# Patient Record
Sex: Male | Born: 2000 | Hispanic: Yes | Marital: Single | Smoking: Current every day smoker
Health system: Southern US, Community
[De-identification: ages and names within clinical notes are randomized; demographics above are authoritative.]

## PROBLEM LIST (undated history)

## (undated) DIAGNOSIS — E7800 Pure hypercholesterolemia, unspecified: Secondary | ICD-10-CM

---

## 2020-06-28 ENCOUNTER — Ambulatory Visit (HOSPITAL_COMMUNITY)
Admission: EM | Admit: 2020-06-28 | Discharge: 2020-06-28 | Disposition: A | Discharge status: Home / Self Care | Payer: Self-pay | Attending: Family Medicine | Admitting: Family Medicine

## 2020-06-28 ENCOUNTER — Other Ambulatory Visit: Payer: Self-pay

## 2020-06-28 ENCOUNTER — Ambulatory Visit (INDEPENDENT_AMBULATORY_CARE_PROVIDER_SITE_OTHER): Payer: Self-pay

## 2020-06-28 ENCOUNTER — Encounter (HOSPITAL_COMMUNITY): Payer: Self-pay

## 2020-06-28 DIAGNOSIS — M25532 Pain in left wrist: Secondary | ICD-10-CM

## 2020-06-28 DIAGNOSIS — M778 Other enthesopathies, not elsewhere classified: Secondary | ICD-10-CM

## 2020-06-28 DIAGNOSIS — S62102S Fracture of unspecified carpal bone, left wrist, sequela: Secondary | ICD-10-CM

## 2020-06-28 HISTORY — DX: Pure hypercholesterolemia, unspecified: E78.00

## 2020-06-28 MED ORDER — IBUPROFEN 800 MG PO TABS: 800.0000 mg | ORAL_TABLET | Freq: Three times a day (TID) | ORAL | 0 refills | Status: DC

## 2020-06-28 NOTE — ED Triage Notes (Addendum)
Pt presents with left wrist pain x 2 weeks. 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 Wrist splint give somewhat Urelief.

## 2020-06-28 NOTE — Discharge Instructions (Signed)
Your x-ray does show an old wrist fracture.  Nothing new is broken or injured You have tendinitis in your wrist You need to wear wrist brace for likely 2 weeks Ice may help after working to reduce pain and inflammation Take ibuprofen 3 times a day with food, to reduce pain and inflammation Be careful with heavy lifting
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
## 2020-07-11 ENCOUNTER — Encounter (HOSPITAL_COMMUNITY): Payer: Self-pay | Admitting: Emergency Medicine

## 2020-07-11 ENCOUNTER — Other Ambulatory Visit: Payer: Self-pay

## 2020-07-11 ENCOUNTER — Ambulatory Visit (HOSPITAL_COMMUNITY)
Admission: EM | Admit: 2020-07-11 | Discharge: 2020-07-11 | Disposition: A | Discharge status: Home / Self Care | Payer: Self-pay | Attending: Family Medicine | Admitting: Family Medicine

## 2020-07-11 ENCOUNTER — Ambulatory Visit (INDEPENDENT_AMBULATORY_CARE_PROVIDER_SITE_OTHER): Payer: Self-pay

## 2020-07-11 DIAGNOSIS — M25532 Pain in left wrist: Secondary | ICD-10-CM

## 2020-07-11 NOTE — Discharge Instructions (Addendum)
Continue to wear the splint until feeling better.  Ibuprofen as needed.  Light duty for 1 week, no heavy lifting.  Sports medicine follow up for continued problems.

## 2020-07-11 NOTE — ED Triage Notes (Signed)
Pt c/o left wrist pain, seen here on 06/28/20 for same. States pain has improved slightly with Rx, and splint. States when he picks things up, feels pain "needles" sensation. Also reports some "tingling" when not performing ROM/use. Here to have determination for work limitations.  +2 radial pulse, brisk cap refill, fingers warm to touch, able to make fist, extend fingers.

## 2020-07-12 NOTE — ED Provider Notes (Signed)
[facility]    CSN: [id number] Arrival date & time: 07/11/20  1156      History   Chief Complaint Chief Complaint  Patient presents with  . Wrist Pain    HPI Calvin Gibson is a 19 y.o. male.   Patient is a 19 year old male presents today with left wrist pain.  Was seen here on 06/28/2020 for same.  Was diagnosed with tendinitis at that time.  He has been wearing splint and using ibuprofen as prescribed.  Reporting some improvement.  He would like to know about returning to work.  Symptoms he has some associated numbness and tingling in the area.  Does a lot of heavy lifting at work.     Past Medical History:  Diagnosis Date  . Hypercholesterolemia     There are no problems to display for this patient.   History reviewed. No pertinent surgical history.     Home Medications    Prior to Admission medications   Medication Sig Start Date End Date Taking? Authorizing Provider  ibuprofen (ADVIL) 800 MG tablet Take 1 tablet (800 mg total) by mouth 3 (three) times daily. 06/28/20  Yes Eustace Moore, MD    Family History History reviewed. No pertinent family history.  Social History Social History   Tobacco Use  . Smoking status: Never Smoker  . Smokeless tobacco: Never Used  Substance Use Topics  . Alcohol use: Never  . Drug use: Never     Allergies   Patient has no known allergies.   Review of Systems Review of Systems   Physical Exam Triage Vital Signs ED Triage Vitals [07/11/20 1442]  Enc Vitals Group     BP 127/62     Pulse Rate 79     Resp 18     Temp 98.2 F (36.8 C)     Temp Source Oral     SpO2 100 %     Weight      Height      Head Circumference      Peak Flow      Pain Score 6     Pain Loc      Pain Edu?      Excl. in GC?    No data found.  Updated Vital Signs BP 127/62 (BP Location: Left Arm)   Pulse 79   Temp 98.2 F (36.8 C) (Oral)   Resp 18   SpO2 100%   Visual Acuity Right Eye Distance:   Left  Eye Distance:   Bilateral Distance:    Right Eye Near:   Left Eye Near:    Bilateral Near:     Physical Exam Vitals and nursing note reviewed.  Constitutional:      Appearance: Normal appearance.  HENT:     Head: Normocephalic and atraumatic.     Nose: Nose normal.  Eyes:     Conjunctiva/sclera: Conjunctivae normal.  Pulmonary:     Effort: Pulmonary effort is normal.  Musculoskeletal:        General: Normal range of motion.     Cervical back: Normal range of motion.     Comments: Normal range of motion of the left wrist without swelling, erythema or bruising.  2+ radial pulse Normal temperature and sensation intact.  Skin:    General: Skin is warm and dry.  Neurological:     Mental Status: He is alert.  Psychiatric:        Mood and Affect: Mood normal.  UC Treatments / Results  Labs (all labs ordered are listed, but only abnormal results are displayed) Labs Reviewed - No data to display  EKG   Radiology DG Wrist Complete Left  Result Date: 07/11/2020 CLINICAL DATA:  Left wrist pain. EXAM: LEFT WRIST - COMPLETE 3+ VIEW COMPARISON:  June 28, 2020. FINDINGS: There is no evidence of acute fracture or dislocation. There is no evidence of arthropathy. Stable old ulnar styloid fracture is noted. Soft tissues are unremarkable. IMPRESSION: No acute abnormality seen in the left wrist. Electronically Signed   By: Lupita Raider M.D.   On: 07/11/2020 15:21    Procedures Procedures (including critical care time)  Medications Ordered in UC Medications - No data to display  Initial Impression / Assessment and Plan / UC Course  I have reviewed the triage vital signs and the nursing notes.  Pertinent labs & imaging results that were available during my care of the patient were reviewed by me and considered in my medical decision making (see chart for details).     Left wrist pain Most likely tendinitis Patient feeling better. Will give 1 more week with light  duty. Splint until feeling better Rest, ice, elevate, ibuprofen for pain Sports medicine for follow-up as needed Final Clinical Impressions(s) / UC Diagnoses   Final diagnoses:  Left wrist pain     Discharge Instructions     Continue to wear the splint until feeling better.  Ibuprofen as needed.  Light duty for 1 week, no heavy lifting.  Sports medicine follow up for continued problems.      ED Prescriptions    None     PDMP not reviewed this encounter.   Janace Aris, NP 07/12/20 [phone]

## 2021-08-17 IMAGING — DX DG WRIST COMPLETE 3+V*L*
4 series · 4 of 4 positions shown · non-contrast
Comparison: June 28, 2020.

CLINICAL DATA: Left wrist pain.

EXAM:
LEFT WRIST - COMPLETE 3+ VIEW

[wrist pa]
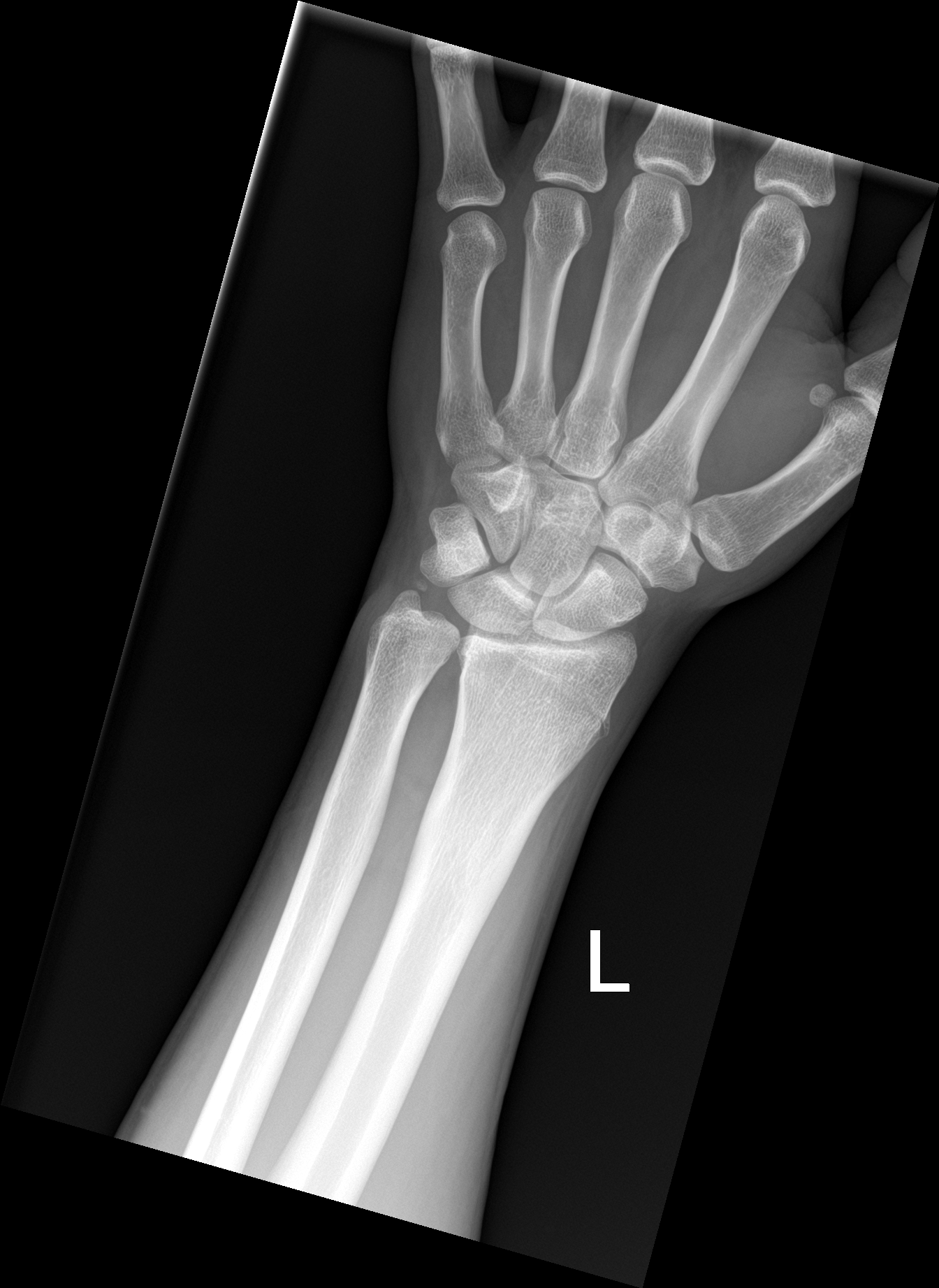

[wrist navicular]
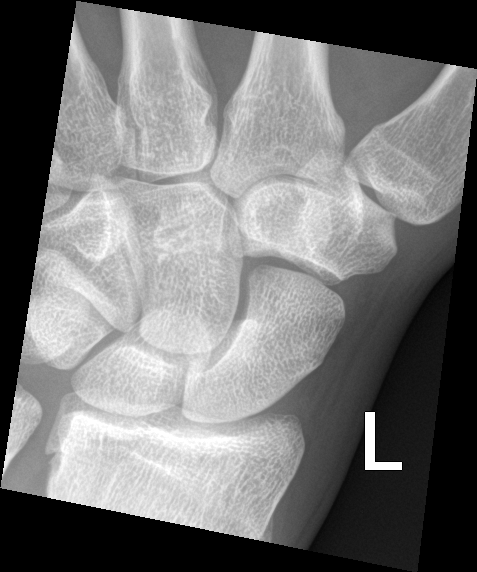

[wrist obl]
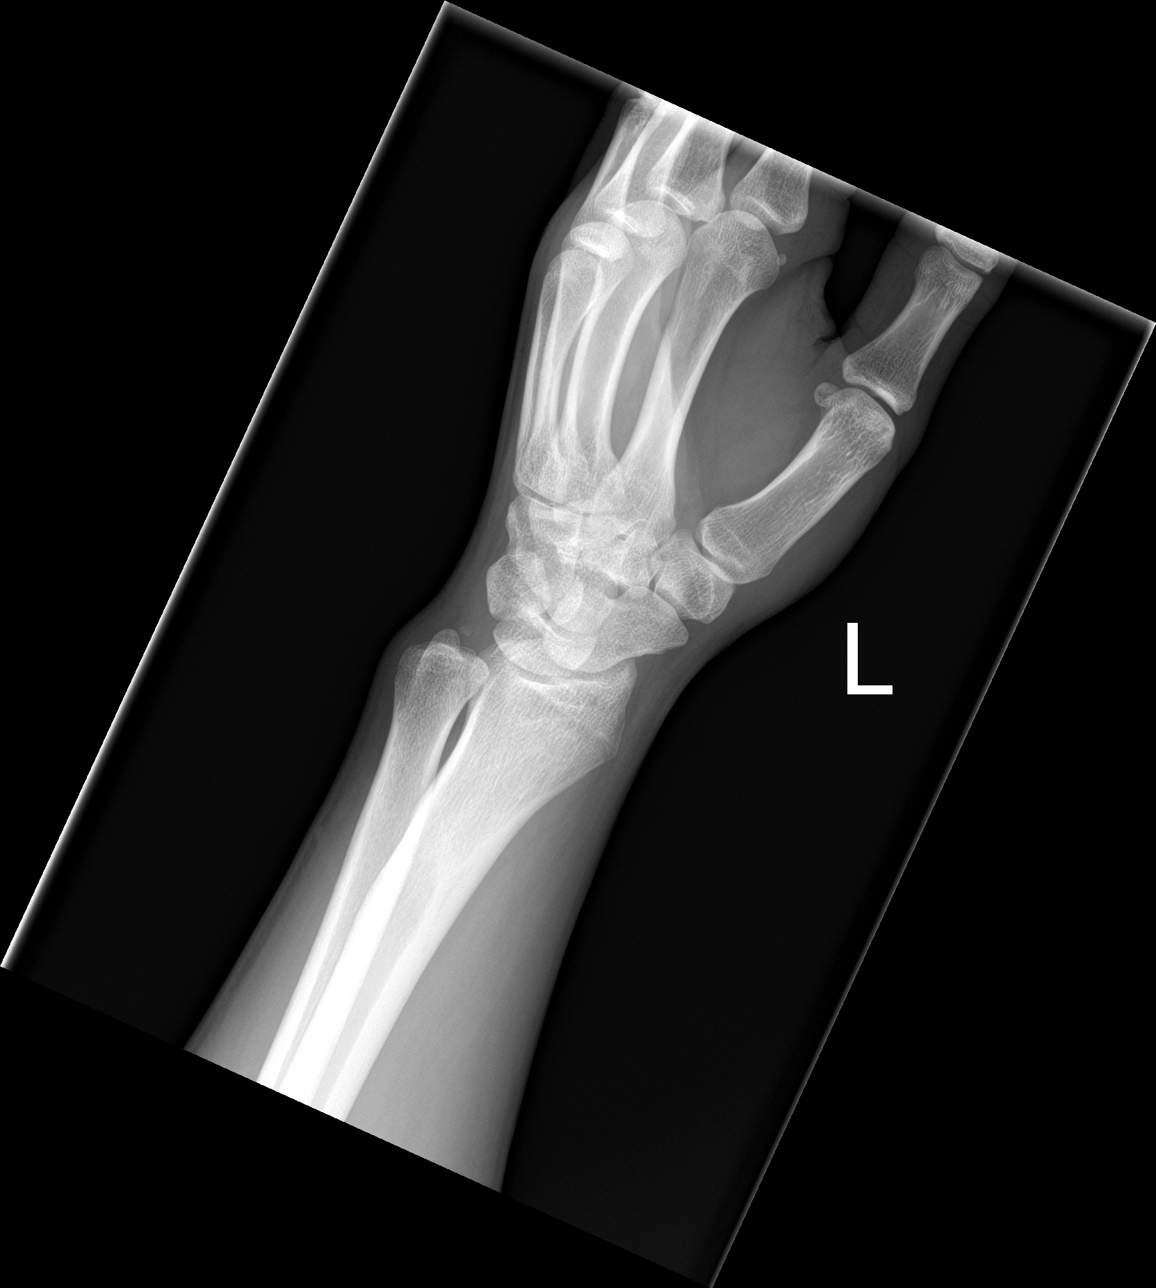

[wrist lat]
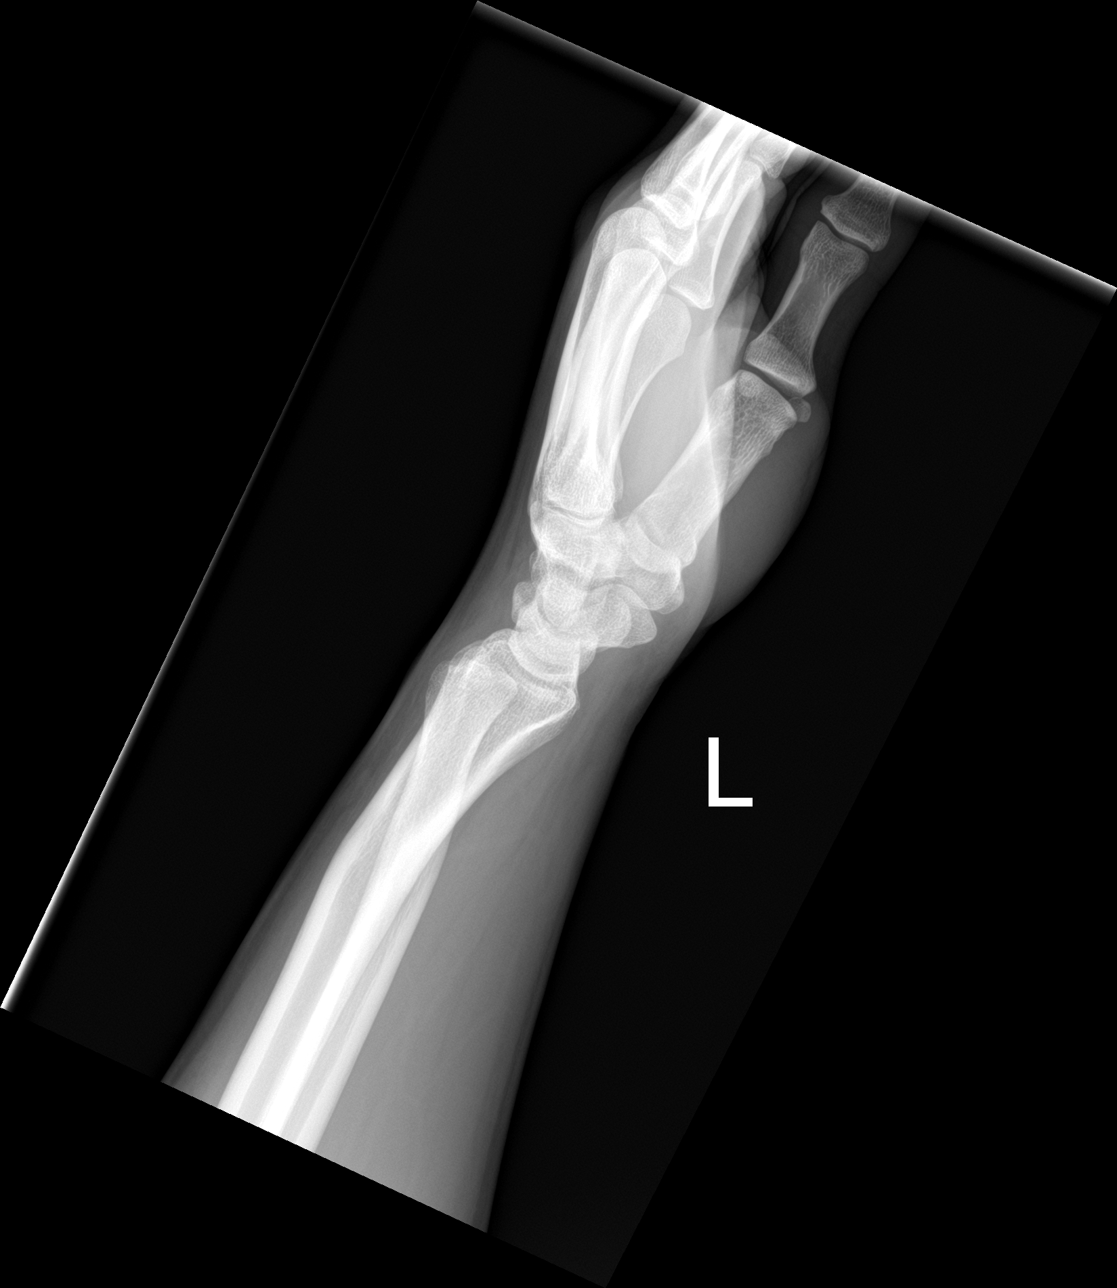

[4 of 4 positions shown; findings below may reference images not displayed]

FINDINGS: There is no evidence of acute fracture or dislocation. There is no
evidence of arthropathy. Stable old ulnar styloid fracture is noted.
Soft tissues are unremarkable.
IMPRESSION: No acute abnormality seen in the left wrist.

## 2023-01-03 ENCOUNTER — Ambulatory Visit
Admission: EM | Admit: 2023-01-03 | Discharge: 2023-01-03 | Disposition: A | Discharge status: Home / Self Care | Payer: PRIVATE HEALTH INSURANCE | Attending: Emergency Medicine | Admitting: Emergency Medicine

## 2023-01-03 ENCOUNTER — Other Ambulatory Visit: Payer: Self-pay

## 2023-01-03 DIAGNOSIS — B349 Viral infection, unspecified: Secondary | ICD-10-CM | POA: Diagnosis present

## 2023-01-03 DIAGNOSIS — Z1152 Encounter for screening for COVID-19: Secondary | ICD-10-CM | POA: Diagnosis not present

## 2023-01-03 HISTORY — DX: Pure hypercholesterolemia, unspecified: E78.00

## 2023-01-03 LAB — POCT INFLUENZA A/B
Influenza A, POC: NEGATIVE
Influenza B, POC: NEGATIVE

## 2023-01-03 NOTE — ED Triage Notes (Signed)
Pt presents to [facility] with [facility] of headache and body aches since last night has taken tylenol with no improvement. Mild cough denies congestion. Denies any sick contacts

## 2023-01-03 NOTE — ED Provider Notes (Signed)
[name]    CSN: [id number] Arrival date & time: 01/03/23  1540    HISTORY  No chief complaint on file.  HPI Calvin Gibson is a pleasant, 22 y.o. male who presents to urgent care today. Patient complains of headache and body ache that began last night.  Patient states he took Tylenol last night without meaningful relief.  Patient also complains of mild, dry cough but denies congestion, sore throat, sick contacts, nausea, vomiting, diarrhea, fever.  Patient is accompanied today by his wife and child who were both well at this time.  The history is provided by the patient.   Past Medical History:  Diagnosis Date   High cholesterol    Hypercholesterolemia    There are no problems to display for this patient.  History reviewed. No pertinent surgical history.  Home Medications    Prior to Admission medications   Medication Sig Start Date End Date Taking? Authorizing Provider  ibuprofen (ADVIL) 800 MG tablet Take 1 tablet (800 mg total) by mouth 3 (three) times daily. 06/28/20   Raylene Everts, MD    Family History History reviewed. No pertinent family history. Social History Social History   Tobacco Use   Smoking status: Every Day    Packs/day: 0.25    Types: Cigarettes   Smokeless tobacco: Never  Substance Use Topics   Alcohol use: Not Currently   Drug use: Never   Allergies   Patient has no known allergies.  Review of Systems Review of Systems Pertinent findings revealed after performing a 14 point review of systems has been noted in the history of present illness.  Physical Exam Vital Signs BP 121/68 (BP Location: Right Arm)   Pulse 73   Temp 99.4 F (37.4 C) (Oral)   Resp 16   SpO2 97%   No data found.  Physical Exam Vitals and nursing note reviewed.  Constitutional:      General: He is not in acute distress.    Appearance: Normal appearance. He is not ill-appearing.  HENT:     Head: Normocephalic and atraumatic.     Salivary  Glands: Right salivary gland is not diffusely enlarged or tender. Left salivary gland is not diffusely enlarged or tender.     Right Ear: Tympanic membrane, ear canal and external ear normal. No drainage. No middle ear effusion. There is no impacted cerumen. Tympanic membrane is not erythematous or bulging.     Left Ear: Tympanic membrane, ear canal and external ear normal. No drainage.  No middle ear effusion. There is no impacted cerumen. Tympanic membrane is not erythematous or bulging.     Nose: Nose normal. No nasal deformity, septal deviation, mucosal edema, congestion or rhinorrhea.     Right Turbinates: Not enlarged, swollen or pale.     Left Turbinates: Not enlarged, swollen or pale.     Right Sinus: No maxillary sinus tenderness or frontal sinus tenderness.     Left Sinus: No maxillary sinus tenderness or frontal sinus tenderness.     Mouth/Throat:     Lips: Pink. No lesions.     Mouth: Mucous membranes are moist. No oral lesions.     Pharynx: Oropharynx is clear. Uvula midline. No posterior oropharyngeal erythema or uvula swelling.     Tonsils: No tonsillar exudate. 0 on the right. 0 on the left.  Eyes:     General: Lids are normal.        Right eye: No discharge.  Left eye: No discharge.     Extraocular Movements: Extraocular movements intact.     Conjunctiva/sclera: Conjunctivae normal.     Right eye: Right conjunctiva is not injected.     Left eye: Left conjunctiva is not injected.  Neck:     Trachea: Trachea and phonation normal.  Cardiovascular:     Rate and Rhythm: Normal rate and regular rhythm.     Pulses: Normal pulses.     Heart sounds: Normal heart sounds. No murmur heard.    No friction rub. No gallop.  Pulmonary:     Effort: Pulmonary effort is normal. No accessory muscle usage, prolonged expiration or respiratory distress.     Breath sounds: Normal breath sounds. No stridor, decreased air movement or transmitted upper airway sounds. No decreased breath  sounds, wheezing, rhonchi or rales.  Chest:     Chest wall: No tenderness.  Musculoskeletal:        General: Normal range of motion.     Cervical back: Normal range of motion and neck supple. Normal range of motion.  Lymphadenopathy:     Cervical: No cervical adenopathy.  Skin:    General: Skin is warm and dry.     Findings: No erythema or rash.  Neurological:     General: No focal deficit present.     Mental Status: He is alert and oriented to person, place, and time.  Psychiatric:        Mood and Affect: Mood normal.        Behavior: Behavior normal.     Visual Acuity Right Eye Distance:   Left Eye Distance:   Bilateral Distance:    Right Eye Near:   Left Eye Near:    Bilateral Near:     UC Couse / Diagnostics / Procedures:     Radiology No results found.  Procedures Procedures (including critical care time) EKG  Pending results:  Labs Reviewed  SARS CORONAVIRUS 2 (TAT 6-24 HRS)  POCT INFLUENZA A/B    Medications Ordered in UC: Medications - No data to display  UC Diagnoses / Final Clinical Impressions(s)   I have reviewed the triage vital signs and the nursing notes.  Pertinent labs & imaging results that were available during my care of the patient were reviewed by me and considered in my medical decision making (see chart for details).    Final diagnoses:  Viral illness   Rapid flu test today is negative.  COVID-19 test is pending.  Will notify patient of result once complete.  Patient provided with a note to be out of work.  Recommend ibuprofen for headache instead of Tylenol.  Conservative care recommended.  Return precautions advised.  Please see discharge instructions below for further details of plan of care as provided to patient. ED Prescriptions   None    PDMP not reviewed this encounter.  Disposition Upon Discharge:  Condition: stable for discharge home Home: take medications as prescribed; routine discharge instructions as discussed;  follow up as advised.  Patient presented with an acute illness with associated systemic symptoms and significant discomfort requiring urgent management. In my opinion, this is a condition that a prudent lay person (someone who possesses an average knowledge of health and medicine) may potentially expect to result in complications if not addressed urgently such as respiratory distress, impairment of bodily function or dysfunction of bodily organs.   Routine symptom specific, illness specific and/or disease specific instructions were discussed with the patient and/or caregiver at length.  As such, the patient has been evaluated and assessed, work-up was performed and treatment was provided in alignment with urgent care protocols and evidence based medicine.  Patient/parent/caregiver has been advised that the patient may require follow up for further testing and treatment if the symptoms continue in spite of treatment, as clinically indicated and appropriate.  If the patient was tested for COVID-19, Influenza and/or RSV, then the patient/parent/guardian was advised to isolate at home pending the results of his/her diagnostic coronavirus test and potentially longer if they're positive. I have also advised pt that if his/her COVID-19 test returns positive, it's recommended to self-isolate for at least 10 days after symptoms first appeared AND until fever-free for 24 hours without fever reducer AND other symptoms have improved or resolved. Discussed self-isolation recommendations as well as instructions for household member/close contacts as per the [facility] and [place] DHHS, and also gave patient the [facility] packet with this information.  Patient/parent/caregiver has been advised to return to the [facility] or PCP in 3-5 days if no better; to PCP or the Emergency Department if new signs and symptoms develop, or if the current signs or symptoms continue to change or worsen for further workup, evaluation and treatment as  clinically indicated and appropriate  The patient will follow up with their current PCP if and as advised. If the patient does not currently have a PCP we will assist them in obtaining one.   The patient may need specialty follow up if the symptoms continue, in spite of conservative treatment and management, for further workup, evaluation, consultation and treatment as clinically indicated and appropriate.  Patient/parent/caregiver verbalized understanding and agreement of plan as discussed.  All questions were addressed during visit.  Please see discharge instructions below for further details of plan.  Discharge Instructions:   Discharge Instructions      Su prueba rpida de antgeno de influenza de hoy fue negativa. No estn indicadas ms pruebas de influenza.  Recibiste una prueba PCR de COVID-19 hoy. El resultado de su prueba de [id number] se publicar en su MyChart una vez que est completo; por lo general, esto demora de 24 a 7 horas.     Si su prueba PCR de COVID-19 es positiva, se comunicar con usted por telfono. Debido a que no tiene antecedentes de inmunodeficiencia, actualmente est vacunado contra el COVID-19, tiene menos de 56 aos y/o no tiene riesgo de Secondary school teacher enfermedad grave debido al COVID-19, el tratamiento antiviral es No indicado.   Si su prueba de PCR de COVID-19 es negativa, considere volver a Administrator, Civil Service los prximos 2 o 3 das, especialmente si no se siente mejor. Le invitamos a regresar aqu a atencin de [place] para que se lo hagan o puede realizar una prueba de COVID-19 en casa.  Si ambas pruebas de COVID-19 son negativas, entonces puede asumir con seguridad que su enfermedad se debe a una de las [employer] menos graves que circulan en nuestra comunidad en [place].   Se recomiendan cuidados conservadores con reposo, beber muchos lquidos claros, comer slo cuando se tenga hambre, tomar medicamentos de apoyo para los sntomas y Clinical cytogeneticist  cerca de Producer, television/film/video. Permanezca en casa hasta que no tenga fiebre durante 24 horas sin el uso de medicamentos contra la fiebre como Tylenol e ibuprofeno.   Lea a continuacin para obtener ms informacin [employer], las dosis y las frecuencias que recomiendo para ayudar a Public house manager sus sntomas y Field seismologist que se sienta mejor pronto:   Advil, Motrin (  ibuprofeno): este es un buen medicamento antiinflamatorio que trata los dolores y la inflamacin de las vas respiratorias superiores que causan congestin nasal y sinusal, as como en las vas respiratorias inferiores, lo que hace que la tos se sienta tirante y, a veces, arde. Te recomiendo que tomes entre 400 y 600 mg cada 6-8 horas segn sea necesario.   Tylenol (acetaminofeno): Este es un buen reductor de la [place]. Si su temperatura corporal aumenta por encima de 101,5 segn lo mide con un termmetro, se recomienda que tome 1000 mg cada 8 horas hasta que su temperatura caiga por debajo de 101,5, no tome ms de 3000 mg de acetaminofn ni como medicamento separado ni como ingrediente en una preparacin de venta libre para el resfriado/gripe dentro de un perodo de 24 horas.   Robitussin, Mucinex (guaifenesina): Este es un expectorante. Esto ayuda a romper la congestin del pecho y Gaffer el drenaje nasal espeso, haciendo que la flema y el drenaje sean ms lquidos y, por lo tanto, ms fciles de Radiographer, therapeutic. Recomiendo 400 mg tres veces al da segn sea necesario.   Realice un seguimiento dentro de los prximos 5 a 7 das, ya sea con su proveedor de atencin primaria o con atencin de urgencia si sus sntomas no desaparecen. Si no tiene un proveedor de [employer], lo ayudaremos a Child psychotherapist.        Gracias por visitar la atencin de urgencia hoy. Apreciamos la oportunidad de Engineer, water atencin.   Your rapid influenza antigen test today was negative.  No further influenza testing is indicated.  You received a COVID-19 PCR test  today.  The result of your COVID-19 test will be posted to your MyChart once it is complete, typically this takes 24 to 36 hours.      If your COVID-19 PCR test is positive, you will be contacted by phone.  Because you do not have a history of being immune compromised, you are currently vaccinated for COVID-19, you are under the age of 32, and/or you do not have a risk of severe disease due to COVID-19, antiviral treatment is not indicated.   If your COVID-19 PCR test is negative, please consider retesting in the next 2 to 3 days, particularly if you are not feeling any better.  You are welcome to return here to urgent care to have it done or you can take a home COVID-19 test.    If both your COVID-19 tests are negative, then you can safely assume that your illness is due to one of the many less serious illnesses circulating in our community right now.     Conservative care is recommended with rest, drinking plenty of clear fluids, eating only when hungry, taking supportive medications for your symptoms and avoiding being around other people.  Please remain at home until you are fever free for 24 hours without the use of antifever medications such as Tylenol and ibuprofen.   Please read below to learn more about the medications, dosages and frequencies that I recommend to help alleviate your symptoms and to get you feeling better soon:   Advil, Motrin (ibuprofen): This is a good anti-inflammatory medication which addresses aches, pains and inflammation of the upper airways that causes sinus and nasal congestion as well as in the lower airways which makes your cough feel tight and sometimes burn.  I recommend that you take between 400 to 600 mg every 6-8 hours as needed.      Tylenol (acetaminophen): This is  a good fever reducer.  If your body temperature rises above 101.5 as measured with a thermometer, it is recommended that you take 1,000 mg every 8 hours until your temperature falls below 101.5,  please not take more than 3,000 mg of acetaminophen either as a separate medication or as in ingredient in an over-the-counter cold/flu preparation within a 24-hour period.      Robitussin, Mucinex (guaifenesin): This is an expectorant.  This helps break up chest congestion and loosen up thick nasal drainage making phlegm and drainage more liquid and therefore easier to remove.  I recommend being 400 mg three times daily as needed.      Please follow-up within the next 5-7 days either with your primary care provider or urgent care if your symptoms do not resolve.  If you do not have a primary care provider, we will assist you in finding one.        Thank you for visiting urgent care today.  We appreciate the opportunity to participate in your care.       This office note has been dictated using Museum/gallery curator.  Unfortunately, this method of dictation can sometimes lead to typographical or grammatical errors.  I apologize for your inconvenience in advance if this occurs.  Please do not hesitate to reach out to me if clarification is needed.      Lynden Oxford Scales, PA-C 01/03/23 1635

## 2023-01-03 NOTE — Discharge Instructions (Addendum)
Su prueba rpida de antgeno de influenza de hoy fue negativa. No estn indicadas ms pruebas de influenza.  Recibiste una prueba PCR de COVID-19 hoy. El resultado de su prueba de [id number] se publicar en su MyChart una vez que est completo; por lo general, esto demora de 24 a 28 horas.     Si su prueba PCR de COVID-19 es positiva, se comunicar con usted por telfono. Debido a que no tiene antecedentes de inmunodeficiencia, actualmente est vacunado contra el COVID-19, tiene menos de 69 aos y/o no tiene riesgo de Secondary school teacher enfermedad grave debido al COVID-19, el tratamiento antiviral es No indicado.   Si su prueba de PCR de COVID-19 es negativa, considere volver a Administrator, Civil Service los prximos 2 o 3 das, especialmente si no se siente mejor. Le invitamos a regresar aqu a atencin de [place] para que se lo hagan o puede realizar una prueba de COVID-19 en casa.  Si ambas pruebas de COVID-19 son negativas, entonces puede asumir con seguridad que su enfermedad se debe a una de las [employer] menos graves que circulan en nuestra comunidad en [place].   Se recomiendan cuidados conservadores con reposo, beber muchos lquidos claros, comer slo cuando se tenga hambre, tomar medicamentos de apoyo para los sntomas y Clinical cytogeneticist cerca de Producer, television/film/video. Permanezca en casa hasta que no tenga fiebre durante 24 horas sin el uso de medicamentos contra la fiebre como Tylenol e ibuprofeno.   Lea a continuacin para obtener ms informacin [employer], las dosis y las frecuencias que recomiendo para ayudar a Public house manager sus sntomas y Field seismologist que se sienta mejor pronto:   Advil, Motrin (ibuprofeno): este es un buen medicamento antiinflamatorio que trata los dolores y la inflamacin de las vas respiratorias superiores que causan congestin nasal y sinusal, as como en las vas respiratorias inferiores, lo que hace que la tos se sienta tirante y, a veces, arde. Te recomiendo que tomes entre 400 y  600 mg cada 6-8 horas segn sea necesario.   Tylenol (acetaminofeno): Este es un buen reductor de la [place]. Si su temperatura corporal aumenta por encima de 101,5 segn lo mide con un termmetro, se recomienda que tome 1000 mg cada 8 horas hasta que su temperatura caiga por debajo de 101,5, no tome ms de 3000 mg de acetaminofn ni como medicamento separado ni como ingrediente en una preparacin de venta libre para el resfriado/gripe dentro de un perodo de 24 horas.   Robitussin, Mucinex (guaifenesina): Este es un expectorante. Esto ayuda a romper la congestin del pecho y Gaffer el drenaje nasal espeso, haciendo que la flema y el drenaje sean ms lquidos y, por lo tanto, ms fciles de Radiographer, therapeutic. Recomiendo 400 mg tres veces al da segn sea necesario.   Realice un seguimiento dentro de los prximos 5 a 7 das, ya sea con su proveedor de atencin primaria o con atencin de urgencia si sus sntomas no desaparecen. Si no tiene un proveedor de [employer], lo ayudaremos a Child psychotherapist.        Gracias por visitar la atencin de urgencia hoy. Apreciamos la oportunidad de Engineer, water atencin.   Your rapid influenza antigen test today was negative.  No further influenza testing is indicated.  You received a COVID-19 PCR test today.  The result of your COVID-19 test will be posted to your MyChart once it is complete, typically this takes 24 to 36 hours.      If your COVID-19 PCR test is positive,  you will be contacted by phone.  Because you do not have a history of being immune compromised, you are currently vaccinated for COVID-19, you are under the age of 10, and/or you do not have a risk of severe disease due to COVID-19, antiviral treatment is not indicated.   If your COVID-19 PCR test is negative, please consider retesting in the next 2 to 3 days, particularly if you are not feeling any better.  You are welcome to return here to urgent care to have it done or you can take a home  COVID-19 test.    If both your COVID-19 tests are negative, then you can safely assume that your illness is due to one of the many less serious illnesses circulating in our community right now.     Conservative care is recommended with rest, drinking plenty of clear fluids, eating only when hungry, taking supportive medications for your symptoms and avoiding being around other people.  Please remain at home until you are fever free for 24 hours without the use of antifever medications such as Tylenol and ibuprofen.   Please read below to learn more about the medications, dosages and frequencies that I recommend to help alleviate your symptoms and to get you feeling better soon:   Advil, Motrin (ibuprofen): This is a good anti-inflammatory medication which addresses aches, pains and inflammation of the upper airways that causes sinus and nasal congestion as well as in the lower airways which makes your cough feel tight and sometimes burn.  I recommend that you take between 400 to 600 mg every 6-8 hours as needed.      Tylenol (acetaminophen): This is a good fever reducer.  If your body temperature rises above 101.5 as measured with a thermometer, it is recommended that you take 1,000 mg every 8 hours until your temperature falls below 101.5, please not take more than 3,000 mg of acetaminophen either as a separate medication or as in ingredient in an over-the-counter cold/flu preparation within a 24-hour period.      Robitussin, Mucinex (guaifenesin): This is an expectorant.  This helps break up chest congestion and loosen up thick nasal drainage making phlegm and drainage more liquid and therefore easier to remove.  I recommend being 400 mg three times daily as needed.      Please follow-up within the next 5-7 days either with your primary care provider or urgent care if your symptoms do not resolve.  If you do not have a primary care provider, we will assist you in finding one.        Thank you for  visiting urgent care today.  We appreciate the opportunity to participate in your care.

## 2023-01-04 LAB — SARS CORONAVIRUS 2 (TAT 6-24 HRS): SARS Coronavirus 2: NEGATIVE
